# Patient Record
Sex: Female | Born: 2000 | Race: White | Hispanic: No | Marital: Single | Smoking: Never smoker
Health system: Southern US, Community
[De-identification: ages and names within clinical notes are randomized; demographics above are authoritative.]

## PROBLEM LIST (undated history)

## (undated) HISTORY — PX: WISDOM TOOTH EXTRACTION: SHX21

---
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
## 2021-06-07 ENCOUNTER — Encounter: Payer: Self-pay | Admitting: Internal Medicine

## 2021-06-07 NOTE — Progress Notes (Signed)
? ? ?Subjective:  ? ? Patient ID: Teresa Le, female    DOB: 08-08-2000, 21 y.o.   MRN: [id number] ? ? ? ? ? ?HPI ?She is here to establish with a new pcp.  Teresa Le is here for  ?Chief Complaint  ?Patient presents with  ? Annual Exam  ? Establish Care  ? ?She currently goes to college in [place] and will be a sophomore this coming year.  This summer she is doing a class in [place] for 3 weeks. ? ?Overall feeling well and has no major concerns. ? ?  ? ? ?Medications and allergies reviewed with patient and updated if appropriate. ? ?No current outpatient medications on file prior to visit.  ? ?No current facility-administered medications on file prior to visit.  ? ? ?Review of Systems  ?Constitutional:  Negative for chills, fatigue and fever.  ?Eyes:  Negative for visual disturbance.  ?Respiratory:  Negative for cough, shortness of breath and wheezing.   ?Cardiovascular:  Negative for chest pain, palpitations and leg swelling.  ?Gastrointestinal:  Negative for abdominal pain, blood in stool, constipation, diarrhea and nausea.  ?Genitourinary:  Negative for dysuria.  ?Musculoskeletal:  Negative for arthralgias and back pain.  ?Skin:  Negative for rash.  ?Neurological:  Negative for light-headedness and headaches.  ?Hematological:  Bruises/bleeds easily.  ?Psychiatric/Behavioral:  Negative for dysphoric mood. The patient is not nervous/anxious.   ? ?   ?Objective:  ? ?Vitals:  ? 06/08/21 1110  ?BP: 110/74  ?Pulse: 73  ?Temp: 98.2 ?F (36.8 ?C)  ?SpO2: 99%  ? ?Filed Weights  ? 06/08/21 1110  ?Weight: 141 lb (64 kg)  ? ?Body mass index is 24.2 kg/m?. ? ?BP Readings from Last 3 Encounters:  ?06/08/21 110/74  ?02/19/16 107/74  ? ? ?Wt Readings from Last 3 Encounters:  ?06/08/21 141 lb (64 kg) (71 %, Z= 0.57)*  ?02/19/16 127 lb 4.8 oz (57.7 kg) (77 %, Z= 0.73)*  ? ?* Growth percentiles are based on CDC (Girls, 2-20 Years) data.  ? ? ?   ? View : No data to display.  ?  ?  ?  ? ? ? ?   ? View : No data to display.  ?  ?  ?   ? ? ? ? ?  ?Physical Exam ?Constitutional: She appears well-developed and well-nourished. No distress.  ?HENT:  ?Head: Normocephalic and atraumatic.  ?Right Ear: External ear normal. Normal ear canal and TM ?Left Ear: External ear normal.  Normal ear canal and TM ?Mouth/Throat: Oropharynx is clear and moist.  ?Eyes: Conjunctivae normal.  ?Neck: Neck supple. No tracheal deviation present. No thyromegaly present.  ?No carotid bruit  ?Cardiovascular: Normal rate, regular rhythm and normal heart sounds.   ?No murmur heard.  No edema. ?Pulmonary/Chest: Effort normal and breath sounds normal. No respiratory distress. She has no wheezes. She has no rales.  ?Breast: deferred   ?Abdominal: Soft. She exhibits no distension. There is no tenderness.  ?Lymphadenopathy: She has no cervical adenopathy.  ?Skin: Skin is warm and dry. She is not diaphoretic.  ?Psychiatric: She has a normal mood and affect. Her behavior is normal.  ? ? ? ?Lab Results  ?Component Value Date  ? WBC 10.2 02/19/2016  ? HGB 11.3 02/19/2016  ? HCT 33.2 02/19/2016  ? PLT 187 02/19/2016  ? GLUCOSE 90 02/19/2016  ? NA 134 (L) 02/19/2016  ? K 3.6 02/19/2016  ? CL 103 02/19/2016  ? CREATININE 0.84 02/19/2016  ? BUN 7  02/19/2016  ? CO2 25 02/19/2016  ? ? ? ? ?   ?Assessment & Plan:  ? ?Physical exam: ?Screening blood work  ordered ?Exercise  competitive cheer ?Weight normal ?Substance abuse  none ? ? ?Reviewed recommended immunizations.  All up-to-date pediatrician ? ? ?Health Maintenance  ?Topic Date Due  ? HPV VACCINES (1 - 2-dose series) Never done  ? HIV Screening  Never done  ? Hepatitis C Screening  Never done  ? TETANUS/TDAP  Never done  ? INFLUENZA VACCINE  08/23/2021  ?  ? ? ? ? ? ? ?See Problem List for Assessment and Plan of chronic medical problems. ? ? ? ? ?

## 2021-06-08 ENCOUNTER — Encounter: Payer: Self-pay | Admitting: Internal Medicine

## 2021-06-08 ENCOUNTER — Ambulatory Visit: Payer: 59 | Admitting: Internal Medicine

## 2021-06-08 VITALS — BP 110/74 | HR 73 | Temp 98.2°F | Ht 64.0 in | Wt 141.0 lb

## 2021-06-08 DIAGNOSIS — J309 Allergic rhinitis, unspecified: Secondary | ICD-10-CM | POA: Insufficient documentation

## 2021-06-08 DIAGNOSIS — Z8709 Personal history of other diseases of the respiratory system: Secondary | ICD-10-CM

## 2021-06-08 DIAGNOSIS — Z0000 Encounter for general adult medical examination without abnormal findings: Secondary | ICD-10-CM

## 2021-06-08 DIAGNOSIS — J3081 Allergic rhinitis due to animal (cat) (dog) hair and dander: Secondary | ICD-10-CM | POA: Diagnosis not present

## 2021-06-08 NOTE — Patient Instructions (Signed)
? ? ? ?Blood work was ordered.   ? ? ?Medications changes include :   none ? ? ? ? ? ?Health Maintenance, Female ?Adopting a healthy lifestyle and getting preventive care are important in promoting health and wellness. Ask your health care provider about: ?The right schedule for you to have regular tests and exams. ?Things you can do on your own to prevent diseases and keep yourself healthy. ?What should I know about diet, weight, and exercise? ?Eat a healthy diet ? ?Eat a diet that includes plenty of vegetables, fruits, low-fat dairy products, and lean protein. ?Do not eat a lot of foods that are high in solid fats, added sugars, or sodium. ?Maintain a healthy weight ?Body mass index (BMI) is used to identify weight problems. It estimates body fat based on height and weight. Your health care provider can help determine your BMI and help you achieve or maintain a healthy weight. ?Get regular exercise ?Get regular exercise. This is one of the most important things you can do for your health. Most adults should: ?Exercise for at least 150 minutes each week. The exercise should increase your heart rate and make you sweat (moderate-intensity exercise). ?Do strengthening exercises at least twice a week. This is in addition to the moderate-intensity exercise. ?Spend less time sitting. Even light physical activity can be beneficial. ?Watch cholesterol and blood lipids ?Have your blood tested for lipids and cholesterol at 20 years of age, then have this test every 5 years. ?Have your cholesterol levels checked more often if: ?Your lipid or cholesterol levels are high. ?You are older than 20 years of age. ?You are at high risk for heart disease. ?What should I know about cancer screening? ?Depending on your health history and family history, you may need to have cancer screening at various ages. This may include screening for: ?Breast cancer. ?Cervical cancer. ?Colorectal cancer. ?Skin cancer. ?Lung cancer. ?What should I  know about heart disease, diabetes, and high blood pressure? ?Blood pressure and heart disease ?High blood pressure causes heart disease and increases the risk of stroke. This is more likely to develop in people who have high blood pressure readings or are overweight. ?Have your blood pressure checked: ?Every 3-5 years if you are 20-86 years of age. ?Every year if you are 20 years old or older. ?Diabetes ?Have regular diabetes screenings. This checks your fasting blood sugar level. Have the screening done: ?Once every three years after age 20 if you are at a normal weight and have a low risk for diabetes. ?More often and at a younger age if you are overweight or have a high risk for diabetes. ?What should I know about preventing infection? ?Hepatitis B ?If you have a higher risk for hepatitis B, you should be screened for this virus. Talk with your health care provider to find out if you are at risk for hepatitis B infection. ?Hepatitis C ?Testing is recommended for: ?Everyone born from 20 through 1964. ?Anyone with known risk factors for hepatitis C. ?Sexually transmitted infections (STIs) ?Get screened for STIs, including gonorrhea and chlamydia, if: ?You are sexually active and are younger than 20 years of age. ?You are older than 20 years of age and your health care provider tells you that you are at risk for this type of infection. ?Your sexual activity has changed since you were last screened, and you are at increased risk for chlamydia or gonorrhea. Ask your health care provider if you are at risk. ?Ask your health care  provider about whether you are at high risk for HIV. Your health care provider may recommend a prescription medicine to help prevent HIV infection. If you choose to take medicine to prevent HIV, you should first get tested for HIV. You should then be tested every 3 months for as long as you are taking the medicine. ?Pregnancy ?If you are about to stop having your period (premenopausal) and  you may become pregnant, seek counseling before you get pregnant. ?Take 400 to 800 micrograms (mcg) of folic acid every day if you become pregnant. ?Ask for birth control (contraception) if you want to prevent pregnancy. ?Osteoporosis and menopause ?Osteoporosis is a disease in which the bones lose minerals and strength with aging. This can result in bone fractures. If you are 20 years old or older, or if you are at risk for osteoporosis and fractures, ask your health care provider if you should: ?Be screened for bone loss. ?Take a calcium or vitamin D supplement to lower your risk of fractures. ?Be given hormone replacement therapy (HRT) to treat symptoms of menopause. ?Follow these instructions at home: ?Alcohol use ?Do not drink alcohol if: ?Your health care provider tells you not to drink. ?You are pregnant, may be pregnant, or are planning to become pregnant. ?If you drink alcohol: ?Limit how much you have to: ?0-1 drink a day. ?Know how much alcohol is in your drink. In the [place] one drink equals one 12 oz bottle of beer (355 mL), one 5 oz glass of wine (148 mL), or one 1? oz glass of hard liquor (44 mL). ?Lifestyle ?Do not use any products that contain nicotine or tobacco. These products include cigarettes, chewing tobacco, and vaping devices, such as e-cigarettes. If you need help quitting, ask your health care provider. ?Do not use street drugs. ?Do not share needles. ?Ask your health care provider for help if you need support or information about quitting drugs. ?General instructions ?Schedule regular health, dental, and eye exams. ?Stay current with your vaccines. ?Tell your health care provider if: ?You often feel depressed. ?You have ever been abused or do not feel safe at home. ?Summary ?Adopting a healthy lifestyle and getting preventive care are important in promoting health and wellness. ?Follow your health care provider's instructions about healthy diet, exercising, and getting tested or screened  for diseases. ?Follow your health care provider's instructions on monitoring your cholesterol and blood pressure. ?This information is not intended to replace advice given to you by your health care provider. Make sure you discuss any questions you have with your health care provider. ?Document Revised: 05/31/2020 Document Reviewed: 05/31/2020 ?Elsevier Patient Education ? 2023 Elsevier Inc. ? ?

## 2022-09-28 ENCOUNTER — Encounter: Payer: Self-pay | Admitting: Internal Medicine

## 2022-10-13 ENCOUNTER — Encounter: Payer: 59 | Admitting: Internal Medicine

## 2022-10-30 ENCOUNTER — Encounter: Payer: 59 | Admitting: Internal Medicine

## 2023-05-29 ENCOUNTER — Ambulatory Visit (INDEPENDENT_AMBULATORY_CARE_PROVIDER_SITE_OTHER): Admitting: Internal Medicine

## 2023-05-29 ENCOUNTER — Telehealth: Payer: Self-pay | Admitting: Internal Medicine

## 2023-05-29 ENCOUNTER — Encounter: Payer: Self-pay | Admitting: Internal Medicine

## 2023-05-29 VITALS — BP 108/72 | HR 80 | Temp 98.7°F | Ht 64.0 in | Wt 140.0 lb

## 2023-05-29 DIAGNOSIS — J329 Chronic sinusitis, unspecified: Secondary | ICD-10-CM | POA: Insufficient documentation

## 2023-05-29 DIAGNOSIS — R229 Localized swelling, mass and lump, unspecified: Secondary | ICD-10-CM | POA: Insufficient documentation

## 2023-05-29 DIAGNOSIS — Z8669 Personal history of other diseases of the nervous system and sense organs: Secondary | ICD-10-CM | POA: Diagnosis not present

## 2023-05-29 NOTE — Telephone Encounter (Signed)
 Copied from CRM [id number]. Topic: Referral - Question >> May 29, 2023 11:59 AM Dimple Francis wrote: Reason for CRM: Patient was being charged [phone] for a facility fee at [facility]. Patient is wanting to know if it can be switched to [facility].

## 2023-05-29 NOTE — Assessment & Plan Note (Addendum)
 New 2 weeks ago noticed a small lump behind her left ear-slightly sore when she noticed it It has decreased in size [place], nontender lump left posterior auricular area Possibly reactive lymph node versus cyst versus lipoma US  ordered

## 2023-05-29 NOTE — Progress Notes (Signed)
    Subjective:    Patient ID: Teresa Le, female    DOB: [birth year], 23 y.o.   MRN: [id number]      HPI Teresa Le is here for  Chief Complaint  Patient presents with   Cyst    Knot behind the left ear     2 weeks ago had an itch behind let ear and felt lump -- it was prominent and it was visible to others.  It was a little sore.  It has gotten a little smaller since she first noticed it.  She denies any pain.  Over the past few months she has had a lot of seasonal allergies and sinus/ear issues.  Allergies have been bad and she has been taking Claritin and Flonase.  She also had a sinus infection in March and was evaluated urgent care for that.  She has chronic sinus issues-has pressure in her sinuses and pressure in her ears.  She has had frequent ear infections.  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  She has not noticed any other lumps or bumps or swollen lymph nodes in her neck  Using claritin and flonase helps some.  She is concerned about her chronic sinus issues and would like further evaluation-she is not sure if a CT of the sinuses would help   Medications and allergies reviewed with patient and updated if appropriate.  No current outpatient medications on file prior to visit.   No current facility-administered medications on file prior to visit.    Review of Systems     Objective:   Vitals:   05/29/23 1035  BP: 108/72  Pulse: 80  Temp: 98.7 F (37.1 C)  SpO2: 97%   BP Readings from Last 3 Encounters:  05/29/23 108/72  06/08/21 110/74  02/19/16 107/74   Wt Readings from Last 3 Encounters:  05/29/23 140 lb (63.5 kg)  06/08/21 141 lb (64 kg) (71%, Z= 0.57)*  02/19/16 127 lb 4.8 oz (57.7 kg) (77%, Z= 0.73)*   * Growth percentiles are based on CDC (Girls, 2-20 Years) data.   Body mass index is 24.03 kg/m.    Physical Exam Constitutional:      General: She is not in acute distress.     Appearance: Normal appearance. She is not ill-appearing.  HENT:     Head: Normocephalic and atraumatic.     Right Ear: Tympanic membrane, ear canal and external ear normal.     Left Ear: Tympanic membrane, ear canal and external ear normal.     Mouth/Throat:     Mouth: Mucous membranes are moist.     Pharynx: No oropharyngeal exudate or posterior oropharyngeal erythema.  Eyes:     Conjunctiva/sclera: Conjunctivae normal.  Neck:     Comments: Left posterior auricular area there is a slight lump in the bone just behind her ear that is nontender, mobile without overlying erythema Cardiovascular:     Rate and Rhythm: Normal rate and regular rhythm.  Musculoskeletal:     Cervical back: Neck supple. No tenderness.  Lymphadenopathy:     Cervical: No cervical adenopathy.  Skin:    General: Skin is warm and dry.  Neurological:     Mental Status: She is alert.            Assessment & Plan:    See Problem List for Assessment and Plan of chronic medical problems.

## 2023-05-29 NOTE — Patient Instructions (Addendum)
   An Ultrasound was ordered for the  lump behind your ear  [facility] at [facility] Address: [address] Phone: [phone]   [facility] [place]  [phone]   A referral was ordered for ENT and someone will call you to schedule an appointment.     Return if symptoms worsen or fail to improve.

## 2023-05-29 NOTE — Assessment & Plan Note (Signed)
 Chronic sinus issues Has seen ENT in the past, but not sure if this was addressed I do not think a CT at this time is the best option-I think proceeding ENT again for further evaluation would be better and they can determine if a CT is necessary Referral to ENT Advised taking Claritin and Flonase regularly

## 2023-05-29 NOTE — Assessment & Plan Note (Signed)
 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

## 2023-05-30 ENCOUNTER — Ambulatory Visit (HOSPITAL_BASED_OUTPATIENT_CLINIC_OR_DEPARTMENT_OTHER)

## 2023-05-30 ENCOUNTER — Ambulatory Visit
Admission: RE | Admit: 2023-05-30 | Discharge: 2023-05-30 | Disposition: A | Discharge status: Home / Self Care | Source: Ambulatory Visit | Attending: Internal Medicine | Admitting: Internal Medicine

## 2023-05-30 DIAGNOSIS — R229 Localized swelling, mass and lump, unspecified: Secondary | ICD-10-CM

## 2023-06-01 ENCOUNTER — Ambulatory Visit (INDEPENDENT_AMBULATORY_CARE_PROVIDER_SITE_OTHER): Admitting: Otolaryngology

## 2023-06-01 ENCOUNTER — Encounter (INDEPENDENT_AMBULATORY_CARE_PROVIDER_SITE_OTHER): Payer: Self-pay | Admitting: Otolaryngology

## 2023-06-01 VITALS — BP 116/77 | HR 66

## 2023-06-01 DIAGNOSIS — R0981 Nasal congestion: Secondary | ICD-10-CM

## 2023-06-01 DIAGNOSIS — H9209 Otalgia, unspecified ear: Secondary | ICD-10-CM

## 2023-06-01 DIAGNOSIS — H938X3 Other specified disorders of ear, bilateral: Secondary | ICD-10-CM

## 2023-06-01 DIAGNOSIS — H9313 Tinnitus, bilateral: Secondary | ICD-10-CM

## 2023-06-01 DIAGNOSIS — H6993 Unspecified Eustachian tube disorder, bilateral: Secondary | ICD-10-CM

## 2023-06-01 DIAGNOSIS — Z8669 Personal history of other diseases of the nervous system and sense organs: Secondary | ICD-10-CM

## 2023-06-01 DIAGNOSIS — M26623 Arthralgia of bilateral temporomandibular joint: Secondary | ICD-10-CM

## 2023-06-01 DIAGNOSIS — Z9109 Other allergy status, other than to drugs and biological substances: Secondary | ICD-10-CM

## 2023-06-01 MED ORDER — FLUTICASONE PROPIONATE 50 MCG/ACT NA SUSP: 2.0000 | Freq: Two times a day (BID) | NASAL | 6 refills | Status: AC

## 2023-06-01 MED ORDER — LEVOCETIRIZINE DIHYDROCHLORIDE 5 MG PO TABS: 5.0000 mg | ORAL_TABLET | Freq: Every evening | ORAL | 3 refills | Status: AC

## 2023-06-01 MED ORDER — OXYMETAZOLINE HCL 0.05 % NA SOLN: 1.0000 | Freq: Two times a day (BID) | NASAL | 0 refills | Status: AC

## 2023-06-01 NOTE — Progress Notes (Signed)
 ENT CONSULT:  Reason for Consult: bilateral ear pain and concern for ear infections  HPI: Discussed the use of AI scribe software for clinical note transcription with the patient, who gave verbal consent to proceed.  History of Present Illness Teresa Le is a 23 year old female with hx of recurrent ear pain fullness here for initial evaluation.  She has a history of recurrent ear discomfort and ear infections, with the most recent episode in March, for which antibiotics were prescribed and alleviated her symptoms. Despite frequent visits to healthcare providers, including campus health services, she is often told there is fluid in her ears, but no evidence of ear infection. She describes the ear pain as a regular occurrence, often feeling the need to clean her ears upon waking.  She has a history of environmental allergies, particularly to dust and pollen, and has been using Claritin on an as-needed basis for the past four days. Allergy testing in high school confirmed allergies to various outdoor allergens. She also reports a history of sinus infections, with the last occurrence in March, coinciding with her ear infection.  Her mother notes frequent complaints of ear pain and recent changes in hearing, including a sensation of echoing. She also mentions a past episode of a knot-like swelling behind her left ear, which has since resolved without intervention.  She has a history of tonsillectomy and has never undergone ear surgery or had ear tubes placed. She was evaluated by [facility] in March of the previous year for similar symptoms, where it was suggested that she might be grinding her teeth in her sleep, potentially contributing to her ear pain.  She is currently a Archivist.   Records Reviewed:  ENT office visit 03/25/22 Ms. Daigler is today with complaint of intermittent right ear pain. Her mother accompanies her on today's visit. She has been experiencing some intermittent right  ear discomfort since December or January. She has not had any infections there has been no drainage or bleeding from the ear. She does not have a history of chronic ear problems as an infant or child. He thinks her hearing is stable. She has been seen here in the past by Dr. Virgia Griffins for peritonsillar abscess and tonsillectomy.     History reviewed. No pertinent past medical history.  Past Surgical History:  Procedure Laterality Date   TONSILLECTOMY  2018   For recurrent peritonsillar abscess   WISDOM TOOTH EXTRACTION      Family History  Problem Relation Age of Onset   Anxiety disorder Mother    Ovarian cancer Mother    Breast cancer Mother    Breast cancer Paternal Grandmother     Social History:  reports that she has never smoked. She has never used smokeless tobacco. She reports that she does not drink alcohol and does not use drugs.  Allergies: No Known Allergies  Medications: I have reviewed the patient's current medications.  The PMH, PSH, Medications, Allergies, and SH were reviewed and updated.  ROS: Constitutional: Negative for fever, weight loss and weight gain. Cardiovascular: Negative for chest pain and dyspnea on exertion. Respiratory: Is not experiencing shortness of breath at rest. Gastrointestinal: Negative for nausea and vomiting. Neurological: Negative for headaches. Psychiatric: The patient is not nervous/anxious  Blood pressure 116/77, pulse 66, SpO2 98%.  PHYSICAL EXAM:  Exam: General: Well-developed, well-nourished Respiratory Respiratory effort: Equal inspiration and expiration without stridor Cardiovascular Peripheral Vascular: Warm extremities with equal color/perfusion Eyes: No nystagmus with equal extraocular motion bilaterally  Neuro/Psych/Balance: Patient oriented to person, place, and time; Appropriate mood and affect; Gait is intact with no imbalance; Cranial nerves I-XII are intact Head and Face Inspection: Normocephalic and atraumatic  without mass or lesion Palpation: Facial skeleton intact without bony stepoffs Salivary Glands: No mass or tenderness Facial Strength: Facial motility symmetric and full bilaterally ENT Pinna: External ear intact and fully developed External canal: Canal is patent with intact skin, no post-auricular lumps or lesions on the left Tympanic Membrane: Clear and mobile External Nose: No scar or anatomic deformity Internal Nose: Septum intact and relatively straight on anterior rhinoscopy. No edema, polyp, or rhinorrhea Lips, Teeth, and gums: Mucosa and teeth intact and viable TMJ: No pain to palpation with full mobility Oral cavity/oropharynx: No erythema or exudate, no lesions present absent tonsils  Neck Neck and Trachea: Midline trachea without mass or lesion Thyroid : No mass or nodularity Lymphatics: No lymphadenopathy  Procedure: no  Assessment/Plan: Encounter Diagnoses  Name Primary?   Bilateral temporomandibular joint pain Yes   Otalgia, unspecified laterality    History of frequent ear infections    Dysfunction of both eustachian tubes    Ear fullness, bilateral    Tinnitus of both ears    Environmental allergies     Assessment and Plan Assessment & Plan Eustachian tube dysfunction We discussed that her ear sx could be related to ETD vs TMJ sy. ETD is suspected due to environmental allergies with ear discomfort, hearing changes (muffled hearing) and intermittent tinnitus. Hearing test  with Tymps needed to assess. Will proceed with management of chronic nasal congestion. Ear exam is unremarkable today.  - Order hearing test to assess eardrum movement and pressure. - Prescribed Afrin for use 24-48 hours before flying, limit to 48 hours to avoid rebound congestion. She has a flight coming up in 2 days.    Chronic nasal congestion and hx of Environmental allergies Confirmed by past testing. Symptoms include ear discomfort and postnasal drainage. Recommend long-term management  with daily antihistamine and Flonase. - Prescribed Xyzal at night to avoid drowsiness. - Prescribed Flonase nasal spray, up to twice daily. - Refer for repeat allergy testing to assess candidacy for allergy shots.  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 Please do not hesitate to contact me with any questions or concerns.   Artice Last, MD Otolaryngology [facility] Phone: [phone] Fax: [phone]    06/01/2023, 9:42 AM

## 2023-06-01 NOTE — Patient Instructions (Signed)

## 2023-06-04 ENCOUNTER — Encounter: Payer: Self-pay | Admitting: Internal Medicine

## 2023-06-21 ENCOUNTER — Ambulatory Visit (INDEPENDENT_AMBULATORY_CARE_PROVIDER_SITE_OTHER): Admitting: Audiology

## 2023-06-21 DIAGNOSIS — H93293 Other abnormal auditory perceptions, bilateral: Secondary | ICD-10-CM

## 2023-06-21 DIAGNOSIS — Z0110 Encounter for examination of ears and hearing without abnormal findings: Secondary | ICD-10-CM

## 2023-06-21 NOTE — Progress Notes (Addendum)
  [address], [address] [place], [place] [address] [phone]  Audiological Evaluation    Name: Teresa Le     DOB:   [birth year]      MRN:   [id number]                                                                                     Service Date: 06/21/2023     Accompanied by: unaccompanied   Patient comes today after Dr. Soldatova, ENT sent a referral for a hearing evaluation due to concerns with ear pain.   Symptoms Yes Details  Hearing loss  []    Tinnitus  []    Ear pain/ infections/pressure  [x]  History of ear pain and echo- also noted when she flew form [place] had a lot of ear pain  Balance problems  []    Noise exposure history  []    Previous ear surgeries  []    Family history of hearing loss  []    Amplification  []    Other  []      Otoscopy: Right ear: Clear external ear canals and notable landmarks visualized on the tympanic membrane. Left ear:  Clear external ear canals and notable landmarks visualized on the tympanic membrane.  Tympanometry: Right ear: Type A- Normal external ear canal volume with normal middle ear pressure and tympanic membrane compliance. Left ear: Type A- Normal external ear canal volume with normal middle ear pressure and tympanic membrane compliance.  Pure tone Audiometry:  Normal hearing from [phone] Hz , in both ears  Speech Audiometry: Right ear- Speech Reception Threshold (SRT) was obtained at 10 dBHL. Left ear-Speech Reception Threshold (SRT) was obtained at 10 dBHL.   Word Recognition Score Tested using NU-6 (MLV) Right ear: 100% was obtained at a presentation level of 55 dBHL with contralateral masking which is deemed as  excellent. Left ear: 96% was obtained at a presentation level of 55 dBHL with contralateral masking which is deemed as  excellent.   The hearing test results were completed under headphones and results are deemed to be of good reliability. Test technique:  conventional      Recommendations: Follow  up with ENT as scheduled .  Return for a hearing evaluation if concerns with hearing changes arise or per MD recommendation.   Corean Yoshimura MARIE LEROUX-MARTINEZ, AUD

## 2023-09-06 ENCOUNTER — Ambulatory Visit (INDEPENDENT_AMBULATORY_CARE_PROVIDER_SITE_OTHER): Admitting: Otolaryngology

## 2023-09-14 ENCOUNTER — Ambulatory Visit (INDEPENDENT_AMBULATORY_CARE_PROVIDER_SITE_OTHER): Admitting: Otolaryngology
